# Patient Record
Sex: Female | Born: 2000 | Race: White | Hispanic: No | Marital: Single | Smoking: Never smoker
Health system: Southern US, Community
[De-identification: ages and names within clinical notes are randomized; demographics above are authoritative.]

---
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
## 2020-07-09 DIAGNOSIS — Z299 Encounter for prophylactic measures, unspecified: Secondary | ICD-10-CM | POA: Diagnosis not present

## 2020-07-09 DIAGNOSIS — Z789 Other specified health status: Secondary | ICD-10-CM | POA: Diagnosis not present

## 2020-07-09 DIAGNOSIS — Z020 Encounter for examination for admission to educational institution: Secondary | ICD-10-CM | POA: Diagnosis not present

## 2020-07-09 DIAGNOSIS — Z6825 Body mass index (BMI) 25.0-25.9, adult: Secondary | ICD-10-CM | POA: Diagnosis not present

## 2020-07-09 DIAGNOSIS — Z0000 Encounter for general adult medical examination without abnormal findings: Secondary | ICD-10-CM | POA: Diagnosis not present

## 2020-07-17 DIAGNOSIS — Z299 Encounter for prophylactic measures, unspecified: Secondary | ICD-10-CM | POA: Diagnosis not present

## 2020-07-17 DIAGNOSIS — Z789 Other specified health status: Secondary | ICD-10-CM | POA: Diagnosis not present

## 2020-07-17 DIAGNOSIS — E538 Deficiency of other specified B group vitamins: Secondary | ICD-10-CM | POA: Diagnosis not present

## 2020-07-17 DIAGNOSIS — R5383 Other fatigue: Secondary | ICD-10-CM | POA: Diagnosis not present

## 2020-07-17 DIAGNOSIS — Z020 Encounter for examination for admission to educational institution: Secondary | ICD-10-CM | POA: Diagnosis not present

## 2020-07-17 DIAGNOSIS — Z6825 Body mass index (BMI) 25.0-25.9, adult: Secondary | ICD-10-CM | POA: Diagnosis not present

## 2020-09-06 IMAGING — DX DG SCOLIOSIS EVAL COMPLETE SPINE 2-3V
2 series · 8 of 8 positions shown · non-contrast
Comparison: None in PACs

CLINICAL DATA: Infantile idiopathic scoliosis

EXAM:
DG SCOLIOSIS EVAL COMPLETE SPINE 2-3V

[Series 1: whole body ap · 0.14mm/px · 4 of 4 slices shown]
[im 1/4]
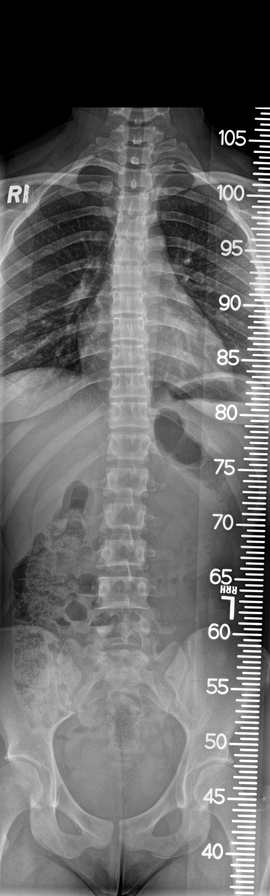
[im 2/4]
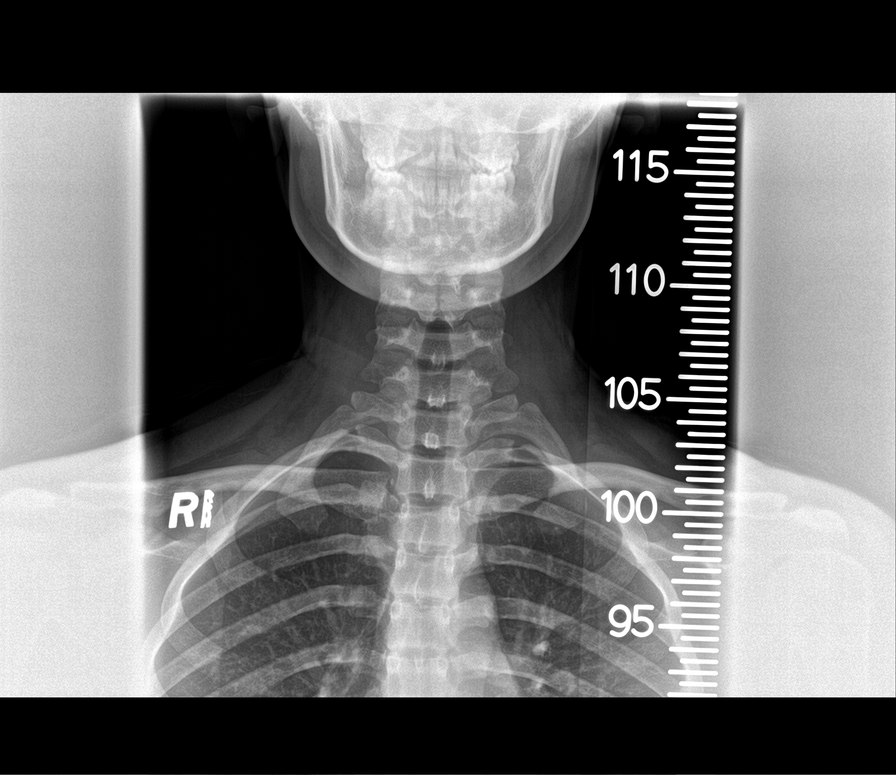
[im 3/4]
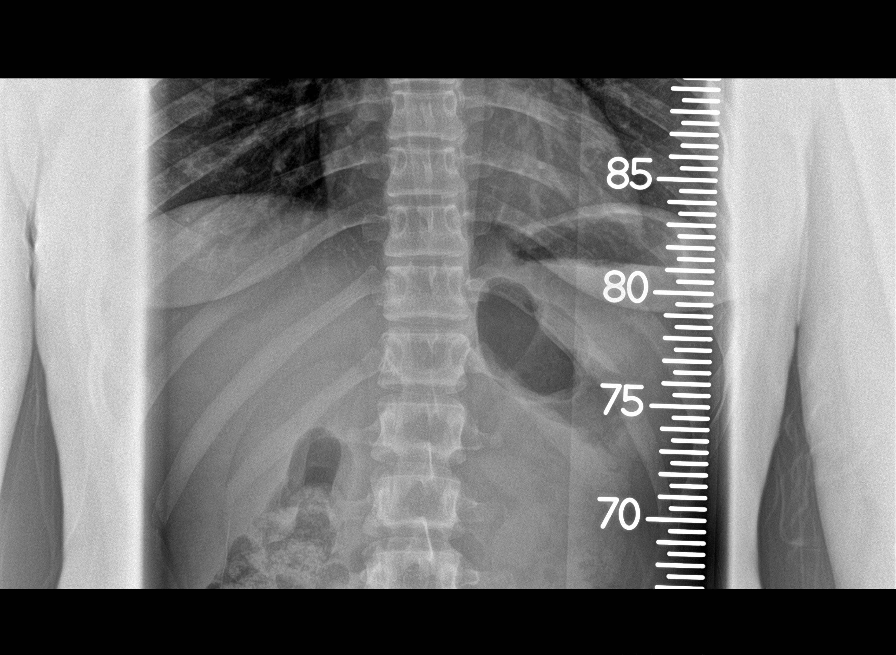
[im 4/4]
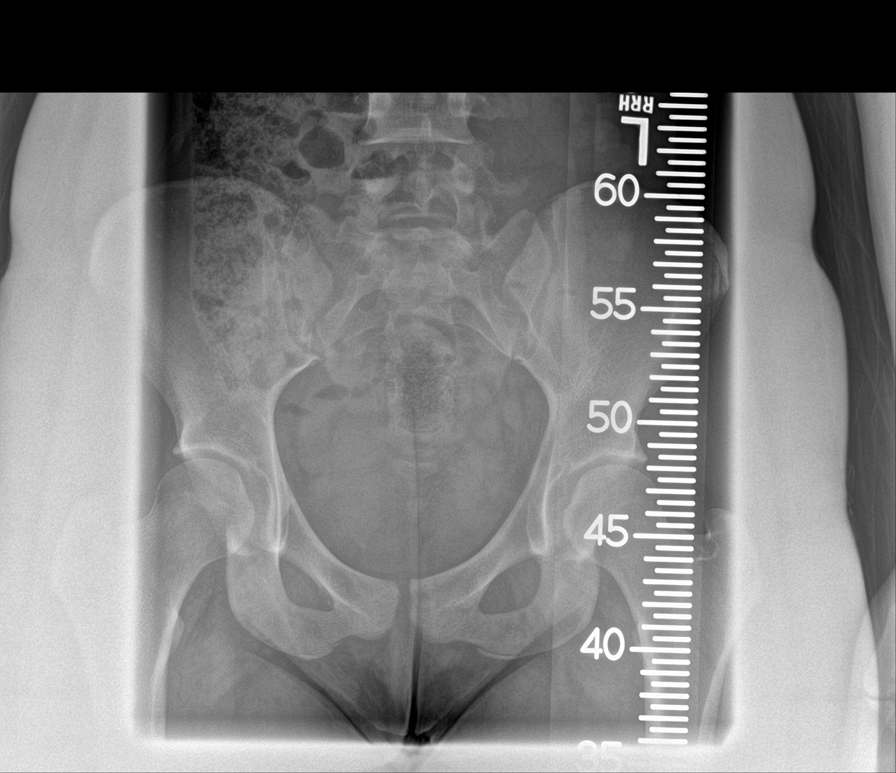

[Series 2: whole body lat · 0.14mm/px · 4 of 4 slices shown]
[im 1/4]
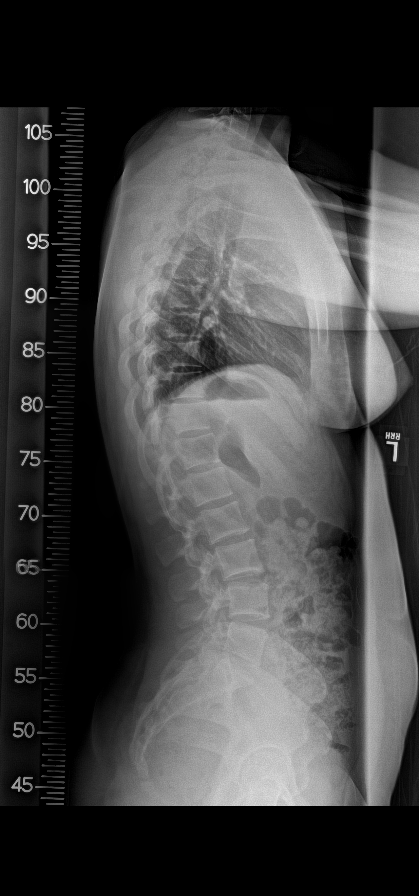
[im 2/4]
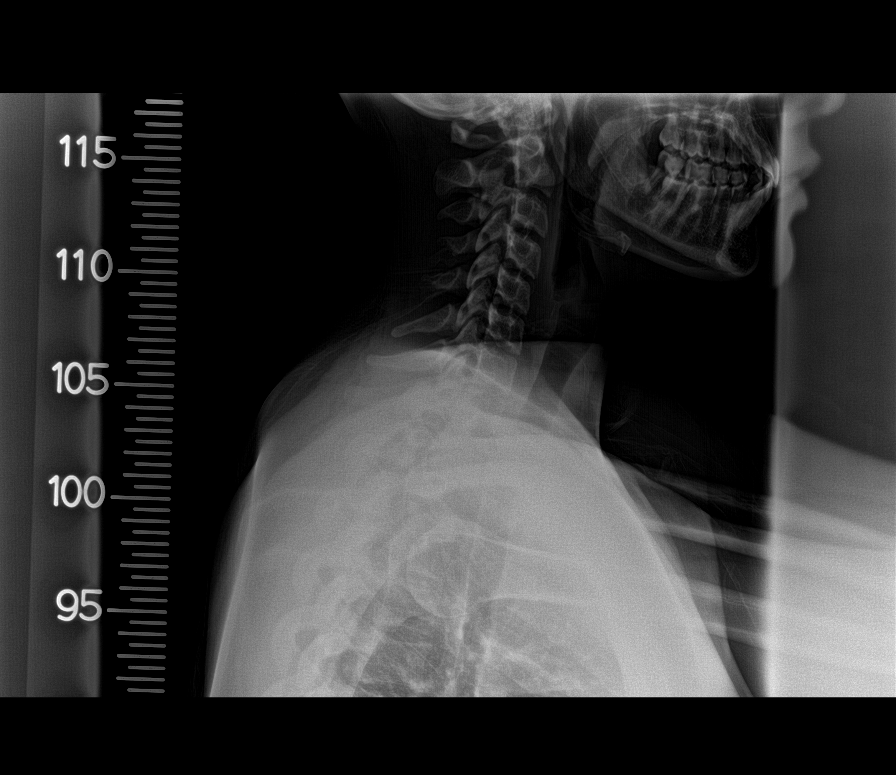
[im 3/4]
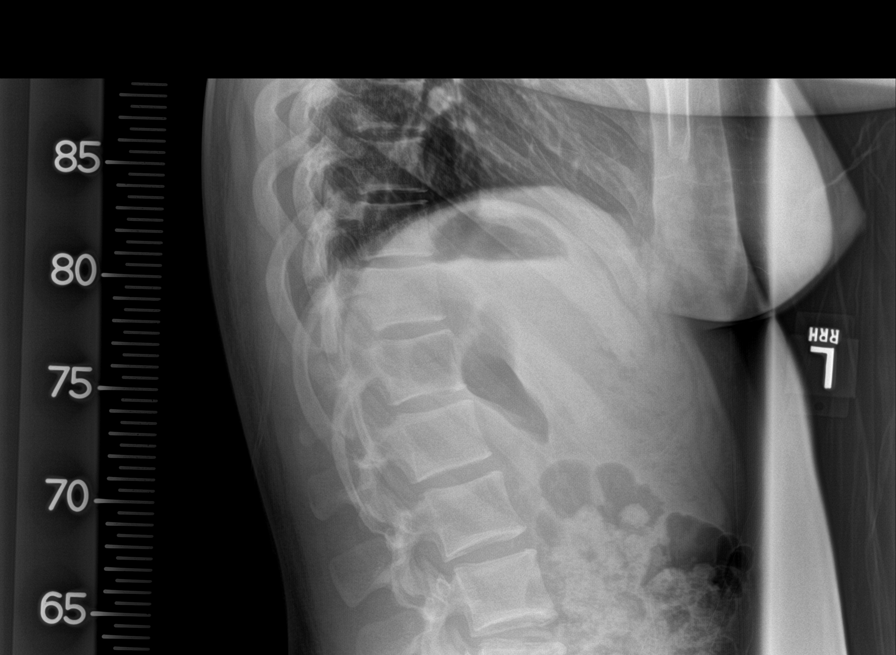
[im 4/4]
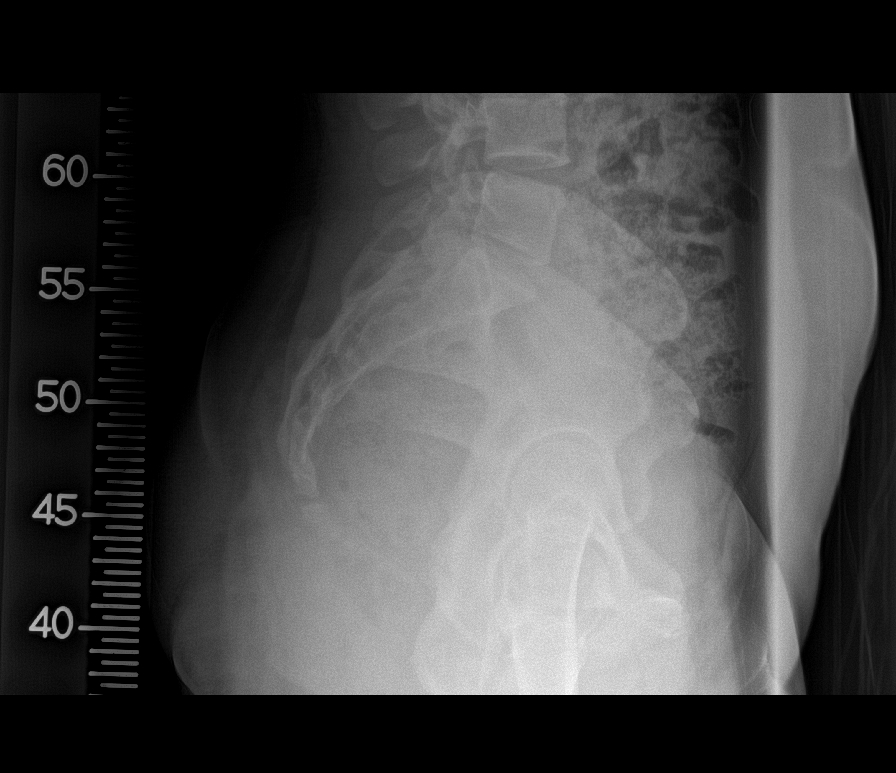

[8 of 8 positions shown; findings below may reference images not displayed]

FINDINGS: There is gentle S shaped thoracolumbar curvature. The upper curve is
centered at T6 and measures 6 degrees convex toward the right. The
lower curve is centered at L1 convex toward the left measuring 4
degrees. No vertebral body anomalies are observed.
IMPRESSION: Gentle S-shaped thoracolumbar scoliosis as described.

## 2021-06-24 DIAGNOSIS — Z6826 Body mass index (BMI) 26.0-26.9, adult: Secondary | ICD-10-CM | POA: Diagnosis not present

## 2021-06-24 DIAGNOSIS — Z789 Other specified health status: Secondary | ICD-10-CM | POA: Diagnosis not present

## 2021-06-24 DIAGNOSIS — Z1331 Encounter for screening for depression: Secondary | ICD-10-CM | POA: Diagnosis not present

## 2021-06-24 DIAGNOSIS — Z0000 Encounter for general adult medical examination without abnormal findings: Secondary | ICD-10-CM | POA: Diagnosis not present

## 2021-06-24 DIAGNOSIS — Z299 Encounter for prophylactic measures, unspecified: Secondary | ICD-10-CM | POA: Diagnosis not present

## 2021-10-12 ENCOUNTER — Other Ambulatory Visit (HOSPITAL_COMMUNITY): Payer: Self-pay

## 2021-10-12 DIAGNOSIS — E559 Vitamin D deficiency, unspecified: Secondary | ICD-10-CM | POA: Diagnosis not present

## 2021-10-12 DIAGNOSIS — Z299 Encounter for prophylactic measures, unspecified: Secondary | ICD-10-CM | POA: Diagnosis not present

## 2021-10-12 DIAGNOSIS — Z2821 Immunization not carried out because of patient refusal: Secondary | ICD-10-CM | POA: Diagnosis not present

## 2021-10-12 DIAGNOSIS — F419 Anxiety disorder, unspecified: Secondary | ICD-10-CM | POA: Diagnosis not present

## 2021-10-12 DIAGNOSIS — Z789 Other specified health status: Secondary | ICD-10-CM | POA: Diagnosis not present

## 2021-10-12 DIAGNOSIS — R5383 Other fatigue: Secondary | ICD-10-CM | POA: Diagnosis not present

## 2021-10-12 DIAGNOSIS — E538 Deficiency of other specified B group vitamins: Secondary | ICD-10-CM | POA: Diagnosis not present

## 2021-10-12 DIAGNOSIS — Z6824 Body mass index (BMI) 24.0-24.9, adult: Secondary | ICD-10-CM | POA: Diagnosis not present

## 2021-10-12 MED ORDER — BUSPIRONE HCL 10 MG PO TABS
ORAL_TABLET | ORAL | 0 refills | Status: AC
  Filled 2021-10-12: qty 45, 15d supply, fill #0

## 2021-10-13 ENCOUNTER — Other Ambulatory Visit (HOSPITAL_COMMUNITY): Payer: Self-pay

## 2021-11-02 DIAGNOSIS — Z6824 Body mass index (BMI) 24.0-24.9, adult: Secondary | ICD-10-CM | POA: Diagnosis not present

## 2021-11-02 DIAGNOSIS — E559 Vitamin D deficiency, unspecified: Secondary | ICD-10-CM | POA: Diagnosis not present

## 2021-11-02 DIAGNOSIS — F419 Anxiety disorder, unspecified: Secondary | ICD-10-CM | POA: Diagnosis not present

## 2021-11-02 DIAGNOSIS — E538 Deficiency of other specified B group vitamins: Secondary | ICD-10-CM | POA: Diagnosis not present

## 2021-11-02 DIAGNOSIS — Z299 Encounter for prophylactic measures, unspecified: Secondary | ICD-10-CM | POA: Diagnosis not present

## 2021-11-26 DIAGNOSIS — D225 Melanocytic nevi of trunk: Secondary | ICD-10-CM | POA: Diagnosis not present

## 2021-11-26 DIAGNOSIS — L700 Acne vulgaris: Secondary | ICD-10-CM | POA: Diagnosis not present

## 2021-11-26 DIAGNOSIS — D2261 Melanocytic nevi of right upper limb, including shoulder: Secondary | ICD-10-CM | POA: Diagnosis not present

## 2022-01-05 DIAGNOSIS — H5212 Myopia, left eye: Secondary | ICD-10-CM | POA: Diagnosis not present

## 2022-03-07 ENCOUNTER — Encounter: Payer: Self-pay | Admitting: Emergency Medicine

## 2022-03-07 ENCOUNTER — Ambulatory Visit
Admission: EM | Admit: 2022-03-07 | Discharge: 2022-03-07 | Disposition: A | Discharge status: Home / Self Care | Payer: 59 | Attending: Urgent Care | Admitting: Urgent Care

## 2022-03-07 DIAGNOSIS — J3489 Other specified disorders of nose and nasal sinuses: Secondary | ICD-10-CM | POA: Diagnosis not present

## 2022-03-07 DIAGNOSIS — R509 Fever, unspecified: Secondary | ICD-10-CM

## 2022-03-07 DIAGNOSIS — R52 Pain, unspecified: Secondary | ICD-10-CM

## 2022-03-07 DIAGNOSIS — B349 Viral infection, unspecified: Secondary | ICD-10-CM | POA: Diagnosis not present

## 2022-03-07 LAB — POCT RAPID STREP A (OFFICE): Rapid Strep A Screen: NEGATIVE

## 2022-03-07 MED ORDER — PSEUDOEPHEDRINE HCL 60 MG PO TABS: 60.0000 mg | ORAL_TABLET | Freq: Three times a day (TID) | ORAL | 0 refills | Status: AC | PRN

## 2022-03-07 MED ORDER — LEVOCETIRIZINE DIHYDROCHLORIDE 5 MG PO TABS: 5.0000 mg | ORAL_TABLET | Freq: Every evening | ORAL | 0 refills | Status: AC

## 2022-03-07 NOTE — ED Provider Notes (Signed)
?[place] ? ? ?MRN: [id number] DOB: 12-Dec-2000 ? ?Subjective:  ? ?Kelsey Duarte is a 21 y.o. female presenting for 1 day history of acute onset fever, malaise, body aches, sinus drainage, sinus pressure.  No throat pain, ear pain, chest pain, shortness of breath, wheezing, nausea, vomiting, abdominal pain.  No rashes.  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 ? ?He is not currently taking any medications and has no known food or drug allergies.  Denies past medical and surgical history. ? ?History reviewed. No pertinent family history. ? ?Social History  ? ?Tobacco Use  ? Smoking status: Never  ? Smokeless tobacco: Never  ?Substance Use Topics  ? Alcohol use: Never  ? Drug use: Never  ? ? ?ROS ? ? ?Objective:  ? ?Vitals: ?BP 106/68 (BP Location: Right Arm)   Pulse 87   Temp 99.4 ?F (37.4 ?C) (Oral)   Resp 18   LMP 02/07/2022 (Exact Date)   SpO2 96%  ? ?Physical Exam ?Constitutional:   ?   General: She is not in acute distress. ?   Appearance: Normal appearance. She is well-developed and normal weight. She is not ill-appearing, toxic-appearing or diaphoretic.  ?HENT:  ?   Head: Normocephalic and atraumatic.  ?   Right Ear: Tympanic membrane, ear canal and external ear normal. No drainage or tenderness. No middle ear effusion. There is no impacted cerumen. Tympanic membrane is not erythematous.  ?   Left Ear: Tympanic membrane, ear canal and external ear normal. No drainage or tenderness.  No middle ear effusion. There is no impacted cerumen. Tympanic membrane is not erythematous.  ?   Nose: Congestion and rhinorrhea present.  ?   Mouth/Throat:  ?   Mouth: Mucous membranes are moist. No oral lesions.  ?   Pharynx: Posterior oropharyngeal erythema (with associated postnasal drainage overlying pharynx) present. No pharyngeal swelling, oropharyngeal exudate or uvula swelling.  ?   Tonsils: No tonsillar exudate or tonsillar abscesses.  ?Eyes:  ?   General: No  scleral icterus.    ?   Right eye: No discharge.     ?   Left eye: No discharge.  ?   Extraocular Movements: Extraocular movements intact.  ?   Right eye: Normal extraocular motion.  ?   Left eye: Normal extraocular motion.  ?   Conjunctiva/sclera: Conjunctivae normal.  ?Cardiovascular:  ?   Rate and Rhythm: Normal rate.  ?   Heart sounds: No murmur heard. ?  No friction rub. No gallop.  ?Pulmonary:  ?   Effort: Pulmonary effort is normal. No respiratory distress.  ?   Breath sounds: No stridor. No wheezing, rhonchi or rales.  ?Chest:  ?   Chest wall: No tenderness.  ?Musculoskeletal:  ?   Cervical back: Normal range of motion and neck supple.  ?Lymphadenopathy:  ?   Cervical: No cervical adenopathy.  ?Skin: ?   General: Skin is warm and dry.  ?Neurological:  ?   General: No focal deficit present.  ?   Mental Status: She is alert and oriented to person, place, and time.  ?Psychiatric:     ?   Mood and Affect: Mood normal.     ?   Behavior: Behavior normal.  ? ? ?Results for orders placed or performed during the hospital encounter of 03/07/22 (from the past 24 hour(s))  ?POCT rapid strep A     Status: None  ? Collection Time: 03/07/22  9:05 AM  ?  Result Value Ref Range  ? Rapid Strep A Screen Negative Negative  ? ? ? ?Assessment and Plan :  ? ?PDMP not reviewed this encounter. ? ?1. Acute viral syndrome   ?2. Fever, unspecified   ?3. Sinus pressure   ?4. Body aches   ? ? Deferred imaging given clear cardiopulmonary exam, hemodynamically stable vital signs.  Strep culture, COVID and flu test pending.  We will otherwise manage for an acute viral syndrome, viral upper respiratory infection.  Physical exam findings reassuring and vital signs stable for discharge. Advised supportive care, offered symptomatic relief. Counseled patient on potential for adverse effects with medications prescribed/recommended today, ER and return-to-clinic precautions discussed, patient verbalized understanding.   ? ?  ?Jaynee Eagles,  PA-C ?03/07/22 1102 ? ?

## 2022-03-07 NOTE — Discharge Instructions (Addendum)
We will notify you of your test results as they arrive and may take between 48-72 hours.  I encourage you to sign up for MyChart if you have not already done so as this can be the easiest way for [place] to communicate results to you online or through a phone app.  Generally, we only contact you if it is a positive test result.  In the meantime, if you develop worsening symptoms including fever, chest pain, shortness of breath despite our current treatment plan then please report to the emergency room as this may be a sign of worsening status from possible viral infection. ? ?Otherwise, we will manage this as a viral syndrome. For sore throat or cough try using a honey-based tea. Use 3 teaspoons of honey with juice squeezed from half lemon. Place shaved pieces of ginger into 1/2-1 cup of water and warm over stove top. Then mix the ingredients and repeat every 4 hours as needed. Please take Tylenol '500mg'$ -'650mg'$  every 6 hours for aches and pains, fevers. Hydrate very well with at least 2 liters of water. Eat light meals such as soups to replenish electrolytes and soft fruits, veggies. Start an antihistamine like Xyzal for postnasal drainage, sinus congestion.  You can take this together with pseudoephedrine (Sudafed) at a dose of 60 mg 2-3 times a day as needed for the same kind of congestion.   ?

## 2022-03-07 NOTE — ED Triage Notes (Signed)
Fever, body aches, green nasal drainage with sinus pressure since last night.   ?

## 2022-03-09 LAB — COVID-19, FLU A+B NAA
Influenza A, NAA: NOT DETECTED
Influenza B, NAA: NOT DETECTED
SARS-CoV-2, NAA: NOT DETECTED

## 2022-12-15 ENCOUNTER — Telehealth: Payer: Commercial Managed Care - PPO | Admitting: Family

## 2022-12-15 DIAGNOSIS — R399 Unspecified symptoms and signs involving the genitourinary system: Secondary | ICD-10-CM

## 2022-12-15 MED ORDER — CEPHALEXIN 500 MG PO CAPS: 500.0000 mg | ORAL_CAPSULE | Freq: Two times a day (BID) | ORAL | 0 refills | Status: DC

## 2022-12-15 NOTE — Progress Notes (Signed)

## 2023-03-28 ENCOUNTER — Other Ambulatory Visit (HOSPITAL_COMMUNITY): Payer: Self-pay

## 2023-06-07 DIAGNOSIS — Z01419 Encounter for gynecological examination (general) (routine) without abnormal findings: Secondary | ICD-10-CM | POA: Diagnosis not present

## 2023-06-07 DIAGNOSIS — R87610 Atypical squamous cells of undetermined significance on cytologic smear of cervix (ASC-US): Secondary | ICD-10-CM | POA: Diagnosis not present

## 2023-06-07 DIAGNOSIS — Z3009 Encounter for other general counseling and advice on contraception: Secondary | ICD-10-CM | POA: Diagnosis not present

## 2023-06-07 DIAGNOSIS — Z1151 Encounter for screening for human papillomavirus (HPV): Secondary | ICD-10-CM | POA: Diagnosis not present

## 2023-06-07 DIAGNOSIS — Z1389 Encounter for screening for other disorder: Secondary | ICD-10-CM | POA: Diagnosis not present

## 2023-06-07 DIAGNOSIS — Z113 Encounter for screening for infections with a predominantly sexual mode of transmission: Secondary | ICD-10-CM | POA: Diagnosis not present

## 2023-06-07 DIAGNOSIS — Z124 Encounter for screening for malignant neoplasm of cervix: Secondary | ICD-10-CM | POA: Diagnosis not present

## 2023-06-07 DIAGNOSIS — Z202 Contact with and (suspected) exposure to infections with a predominantly sexual mode of transmission: Secondary | ICD-10-CM | POA: Diagnosis not present

## 2023-12-15 DIAGNOSIS — R102 Pelvic and perineal pain: Secondary | ICD-10-CM | POA: Diagnosis not present

## 2023-12-15 DIAGNOSIS — N898 Other specified noninflammatory disorders of vagina: Secondary | ICD-10-CM | POA: Diagnosis not present

## 2023-12-15 DIAGNOSIS — R87610 Atypical squamous cells of undetermined significance on cytologic smear of cervix (ASC-US): Secondary | ICD-10-CM | POA: Diagnosis not present

## 2023-12-30 DIAGNOSIS — H5201 Hypermetropia, right eye: Secondary | ICD-10-CM | POA: Diagnosis not present

## 2023-12-30 DIAGNOSIS — H43392 Other vitreous opacities, left eye: Secondary | ICD-10-CM | POA: Diagnosis not present

## 2024-01-10 DIAGNOSIS — Z0000 Encounter for general adult medical examination without abnormal findings: Secondary | ICD-10-CM | POA: Diagnosis not present

## 2024-01-10 DIAGNOSIS — F419 Anxiety disorder, unspecified: Secondary | ICD-10-CM | POA: Diagnosis not present

## 2024-01-10 DIAGNOSIS — Z1331 Encounter for screening for depression: Secondary | ICD-10-CM | POA: Diagnosis not present

## 2024-01-10 DIAGNOSIS — Z6826 Body mass index (BMI) 26.0-26.9, adult: Secondary | ICD-10-CM | POA: Diagnosis not present

## 2024-01-10 DIAGNOSIS — Z299 Encounter for prophylactic measures, unspecified: Secondary | ICD-10-CM | POA: Diagnosis not present

## 2024-02-07 DIAGNOSIS — D225 Melanocytic nevi of trunk: Secondary | ICD-10-CM | POA: Diagnosis not present

## 2024-02-07 DIAGNOSIS — D2262 Melanocytic nevi of left upper limb, including shoulder: Secondary | ICD-10-CM | POA: Diagnosis not present

## 2024-02-07 DIAGNOSIS — L700 Acne vulgaris: Secondary | ICD-10-CM | POA: Diagnosis not present

## 2024-02-07 DIAGNOSIS — D2272 Melanocytic nevi of left lower limb, including hip: Secondary | ICD-10-CM | POA: Diagnosis not present

## 2024-02-07 DIAGNOSIS — D2271 Melanocytic nevi of right lower limb, including hip: Secondary | ICD-10-CM | POA: Diagnosis not present

## 2024-02-07 DIAGNOSIS — D2261 Melanocytic nevi of right upper limb, including shoulder: Secondary | ICD-10-CM | POA: Diagnosis not present

## 2024-06-14 DIAGNOSIS — Z130 Encounter for screening for diseases of the blood and blood-forming organs and certain disorders involving the immune mechanism: Secondary | ICD-10-CM | POA: Diagnosis not present

## 2024-06-14 DIAGNOSIS — Z01419 Encounter for gynecological examination (general) (routine) without abnormal findings: Secondary | ICD-10-CM | POA: Diagnosis not present

## 2024-06-14 DIAGNOSIS — Z1389 Encounter for screening for other disorder: Secondary | ICD-10-CM | POA: Diagnosis not present

## 2024-09-11 ENCOUNTER — Telehealth: Admitting: Family Medicine

## 2024-09-11 DIAGNOSIS — R399 Unspecified symptoms and signs involving the genitourinary system: Secondary | ICD-10-CM | POA: Diagnosis not present

## 2024-09-11 MED ORDER — CEPHALEXIN 500 MG PO CAPS: 500.0000 mg | ORAL_CAPSULE | Freq: Two times a day (BID) | ORAL | 0 refills | Status: AC

## 2024-09-11 NOTE — Progress Notes (Signed)

## 2024-09-21 ENCOUNTER — Ambulatory Visit: Admitting: Physician Assistant

## 2024-10-08 ENCOUNTER — Encounter: Payer: Self-pay | Admitting: Radiology
